# Patient Record
Sex: Male | Born: 1975 | Race: White | Hispanic: No | Marital: Married | Smoking: Current every day smoker
Health system: Southern US, Community
[De-identification: ages and names within clinical notes are randomized; demographics above are authoritative.]

## PROBLEM LIST (undated history)

## (undated) DIAGNOSIS — M1990 Unspecified osteoarthritis, unspecified site: Secondary | ICD-10-CM

## (undated) DIAGNOSIS — F4310 Post-traumatic stress disorder, unspecified: Secondary | ICD-10-CM

## (undated) DIAGNOSIS — N200 Calculus of kidney: Secondary | ICD-10-CM

## (undated) DIAGNOSIS — S0990XA Unspecified injury of head, initial encounter: Secondary | ICD-10-CM

## (undated) DIAGNOSIS — R51 Headache: Secondary | ICD-10-CM

## (undated) HISTORY — PX: KNEE SURGERY: SHX244

## (undated) HISTORY — PX: POSTERIOR CERVICAL LAMINECTOMY: SHX2248

## (undated) HISTORY — PX: CERVICAL FUSION: SHX112

## (undated) HISTORY — PX: OTHER SURGICAL HISTORY: SHX169

---

## 1998-06-24 ENCOUNTER — Emergency Department (HOSPITAL_COMMUNITY): Admission: EM | Admit: 1998-06-24 | Discharge: 1998-06-25 | Payer: Self-pay | Admitting: Emergency Medicine

## 1998-06-25 ENCOUNTER — Encounter: Payer: Self-pay | Admitting: Emergency Medicine

## 1998-06-27 ENCOUNTER — Observation Stay (HOSPITAL_COMMUNITY): Admission: RE | Admit: 1998-06-27 | Discharge: 1998-06-28 | Payer: Self-pay | Admitting: Specialist

## 2007-02-23 ENCOUNTER — Ambulatory Visit (HOSPITAL_BASED_OUTPATIENT_CLINIC_OR_DEPARTMENT_OTHER): Admission: RE | Admit: 2007-02-23 | Discharge: 2007-02-23 | Payer: Self-pay | Admitting: Orthopedic Surgery

## 2007-03-27 ENCOUNTER — Emergency Department (HOSPITAL_COMMUNITY): Admission: EM | Admit: 2007-03-27 | Discharge: 2007-03-27 | Payer: Self-pay | Admitting: Emergency Medicine

## 2007-12-29 ENCOUNTER — Encounter
Admission: RE | Admit: 2007-12-29 | Discharge: 2007-12-29 | Payer: Self-pay | Admitting: Physical Medicine & Rehabilitation

## 2008-05-22 ENCOUNTER — Encounter
Admission: RE | Admit: 2008-05-22 | Discharge: 2008-07-17 | Payer: Self-pay | Admitting: Physical Medicine & Rehabilitation

## 2008-05-23 ENCOUNTER — Ambulatory Visit: Payer: Self-pay | Admitting: Physical Medicine & Rehabilitation

## 2008-06-05 ENCOUNTER — Encounter: Admission: RE | Admit: 2008-06-05 | Discharge: 2008-06-20 | Payer: Self-pay | Admitting: Anesthesiology

## 2008-06-06 ENCOUNTER — Ambulatory Visit: Payer: Self-pay | Admitting: Anesthesiology

## 2008-06-20 ENCOUNTER — Ambulatory Visit: Payer: Self-pay | Admitting: Anesthesiology

## 2008-07-17 ENCOUNTER — Encounter
Admission: RE | Admit: 2008-07-17 | Discharge: 2008-07-18 | Payer: Self-pay | Admitting: Physical Medicine & Rehabilitation

## 2008-07-18 ENCOUNTER — Ambulatory Visit: Payer: Self-pay | Admitting: Physical Medicine & Rehabilitation

## 2008-08-28 ENCOUNTER — Encounter: Admission: RE | Admit: 2008-08-28 | Discharge: 2008-08-28 | Payer: Self-pay | Admitting: Anesthesiology

## 2008-09-08 ENCOUNTER — Emergency Department (HOSPITAL_COMMUNITY): Admission: EM | Admit: 2008-09-08 | Discharge: 2008-09-09 | Payer: Self-pay | Admitting: Emergency Medicine

## 2010-10-03 LAB — DIFFERENTIAL
Basophils Absolute: 0 10*3/uL (ref 0.0–0.1)
Basophils Relative: 0 % (ref 0–1)
Eosinophils Absolute: 0.1 10*3/uL (ref 0.0–0.7)
Eosinophils Relative: 1 % (ref 0–5)
Lymphocytes Relative: 24 % (ref 12–46)
Lymphs Abs: 2.1 10*3/uL (ref 0.7–4.0)
Monocytes Absolute: 0.9 10*3/uL (ref 0.1–1.0)
Monocytes Relative: 10 % (ref 3–12)
Neutro Abs: 5.9 10*3/uL (ref 1.7–7.7)
Neutrophils Relative %: 66 % (ref 43–77)

## 2010-10-03 LAB — COMPREHENSIVE METABOLIC PANEL
ALT: 17 U/L (ref 0–53)
AST: 21 U/L (ref 0–37)
Albumin: 4.4 g/dL (ref 3.5–5.2)
Alkaline Phosphatase: 70 U/L (ref 39–117)
BUN: 12 mg/dL (ref 6–23)
CO2: 25 mEq/L (ref 19–32)
Calcium: 9.6 mg/dL (ref 8.4–10.5)
Chloride: 103 mEq/L (ref 96–112)
Creatinine, Ser: 0.93 mg/dL (ref 0.4–1.5)
GFR calc Af Amer: >60 mL/min (ref >60)
GFR calc non Af Amer: >60 mL/min (ref >60)
Glucose, Bld: 98 mg/dL (ref 70–99)
Potassium: 3.6 mEq/L (ref 3.5–5.1)
Sodium: 137 mEq/L (ref 135–145)
Total Bilirubin: 1 mg/dL (ref 0.3–1.2)
Total Protein: 7.5 g/dL (ref 6.0–8.3)

## 2010-10-03 LAB — LIPASE, BLOOD: Lipase: 42 U/L (ref 11–59)

## 2010-10-03 LAB — CBC
HCT: 43.2 % (ref 39.0–52.0)
Hemoglobin: 15.2 g/dL (ref 13.0–17.0)
MCHC: 35.2 g/dL (ref 30.0–36.0)
MCV: 89.5 fL (ref 78.0–100.0)
Platelets: 271 10*3/uL (ref 150–400)
RBC: 4.83 MIL/uL (ref 4.22–5.81)
RDW: 11.8 % (ref 11.5–15.5)
WBC: 9 10*3/uL (ref 4.0–10.5)

## 2010-10-03 LAB — URINALYSIS, ROUTINE W REFLEX MICROSCOPIC
Bilirubin Urine: NEGATIVE
Ketones, ur: 15 mg/dL — AB
Nitrite: NEGATIVE
Protein, ur: 30 mg/dL — AB
Urobilinogen, UA: 1 mg/dL (ref 0.0–1.0)

## 2010-11-05 NOTE — Assessment & Plan Note (Signed)
A 35 year old male status post cervical fusion few C1-C3 areas, disk  degeneration C5-6 area as well.  The patient request non-narcotic  treatment.  He has undergone medial branch block per Dr. Stevphen Rochester,  June 06, 2008, bilateral C3, C4, C5, C6.  He had 100% relief for  several days following the first procedure.  He had a repeat procedure 2  weeks later demonstrating about 50% relief that lasted for several days.  Oswestry index score is 42%.  Current pain is 3/10.   REVIEW OF SYSTEMS:  Positive for depression, anxiety, night sweats,  constipation, urine problems as well as poor appetite.   MEDICATIONS:  MS Contin and Tylox as prescribed by Dr. Marina Goodell.   PHYSICAL EXAMINATION:  VITAL SIGNS:  Blood pressure 111/68, pulse 61,  respirations 18, and O2 sat 98% on room air.  GENERAL:  No acute distress.  NEUROLOGIC:  Orientation x3.  Affect is depressed, irritable.  Gait is  normal.  EXTREMITIES:  His strength is full in bilateral upper and lower  extremities, tenderness along the cervical paraspinal.  Sensation  normal.  Deep tendon reflexes normal.   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  We will send to Dr. Stevphen Rochester for radiofrequency for some right  side.  I will see him back after Dr. Stevphen Rochester has done with his  radiofrequency procedures may need some physical therapy afterwards.      Erick Colace, M.D.  Electronically Signed     AEK/MedQ  D:  07/18/2008 16:16:30  T:  07/19/2008 04:59:52  Job #:  [id number]   cc:   Brent Bulla, MD

## 2010-11-05 NOTE — Procedures (Signed)
NAME:  Chad Lowe, Chad Lowe NO.:  [id number]   MEDICAL RECORD NO.:  [id number]           PATIENT TYPE:   LOCATION:                                 FACILITY:   PHYSICIAN:  Celene Kras, MD        DATE OF BIRTH:  [birth year]   DATE OF PROCEDURE:  DATE OF DISCHARGE:                               OPERATIVE REPORT   [facility].  I  evaluated him and reviewed the Health and History form and 14-point  review of systems.  He is accompanied with his wife.  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  Now  it is spondylitic pain and review available.  Imaging in progress today.  Difficulty with endurance range of motion activities.  Suprascapular and  levator scapular pain interfering with his ability to work, and his  normal activities through the day.  He does not want to escalation  controlled substances, he has difficulty with sleep as well.  It is  reasonable to block the facet, particularly most problematic levels at  3, 4, 5, and 6 with contributory innervation addressed, right and left  side.  Rationales to minimize escalation controlled substances and maybe  one of our best nonsurgical approaches to moving forward with RF.  Benchmarks are given.  Other lifestyle enhancements for best outcome  including cigarette cessation are mandatory, and he will talk this over  with primary care.  He has both parents with COPD, mother dying early  from it, and so he is at pretty high risk.   Objectively, diffuse paracervical myofascial discomfort with positive  cervical facetal compression test, right and left.  Suboccipital  compression test positive, pain with extension.  Nothing new  neurologically.   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   PLAN:  Cervical facet medial branch  intervention C3, 4, 5, and 6 with  contributory innervation addressed, right and left side under local  anesthetic, independent needle access points.  Predicate further  intervention based on need and overall response.  Questions were  answered and discussed in lay terms, and I have used models.   The patient was taken to fluoroscopy suite and placed in supine  position.  Neck was prepped and draped in the usual fashion.  Using a 25-  gauge needle under local anesthetic, I advanced the cervical facet at  the medial branch C3, 4, 5, and C6, right and left side independent  needle access points.  Confirmed placement.  I then injected 0.5 mL of  lidocaine 1% MPF at each level with a total of 40 mg Aristocort in  divided dose.   He tolerated the procedure well.  No complications from the procedure.  Appropriate recovery was discussed with contact of  activities of daily  living.  We will see him in followup.            ______________________________  Celene Kras,  MD     HH/MEDQ  D:  06/06/2008 14:39:33  T:  06/07/2008 04:05:15  Job:  [id number]

## 2010-11-05 NOTE — Procedures (Signed)
NAME:  Chad Lowe, RUBEY NO.:  [id number]   MEDICAL RECORD NO.:  [id number]           PATIENT TYPE:   LOCATION:                                 FACILITY:   PHYSICIAN:  Celene Kras, MD        DATE OF BIRTH:  03-Oct-1975   DATE OF PROCEDURE:  DATE OF DISCHARGE:                               OPERATIVE REPORT   1. [place] had a 3-5 day near complete diminution in pain perception      after previous medial branch intervention, it is reasonable to go      on to another block sequentially, with dense local anesthetic to      assess efficacy prior to moving to RF.  Added biomechanical stress      below surgical fixation site, with the seat lowered, aggravated by      smoking and lifestyle issues.  I have reviewed that with him.   1. Other modifiable features in health profile discussed.  Home based      therapy, cigarette cessation, maintain contact with primary care.   Objectively, no significant interval change, diffuse paracervical  myofascial, positive cervical stable compression test right and left.  Suboccipital compression test positive.  Nothing new neurologically.   IMPRESSION:  Spondylosis, minimal myelopathy.  Status post fusion C1-2,  status post neck fracture.   PLAN:  Cervical facet medial branch intervention 3, 4, 5, and 6, right  and left side.  Independent needle access points under local anesthetic.  Contributory innervation addressed.  He has consented for today's  procedure.  We used 0.5% Marcaine, follow up with him and see him back  in a month to determine if RF is a viable option for him, with  benchmarks given.  He has consented for today's procedure.   The patient was taken to the fluoroscopy suite, placed in supine  position.  Neck was prepped and draped in usual fashion.  Using a 25-  gauge needle, I advanced the cervical facet at the medial branch at C3,  C4, C5, and C6 right and left side.  Independent needle access points  confirmed  placement, I then injected 0.5% mL of Marcaine 0.5% MPF at  each level with a total of 40 mg Aristocort in divided dose, right and  left side included.   He tolerated the procedure well.  We will assess within the context of  activities of daily living.  We will see him in followup.  No barrier to  communication.           ______________________________  Celene Kras, MD     HH/MEDQ  D:  06/20/2008 10:21:49  T:  06/20/2008 22:52:17  Job:  [id number]

## 2010-11-05 NOTE — Op Note (Signed)
[name], Chad Lowe              ACCOUNT NO.:  [id number]   MEDICAL RECORD NO.:  [id number]          PATIENT TYPE:  AMB   LOCATION:  DSC                          FACILITY:  [facility]   PHYSICIAN:  Chad Fitch. Lowe, M.D. DATE OF BIRTH:  11-19-1975   DATE OF PROCEDURE:  02/23/2007  DATE OF DISCHARGE:                               OPERATIVE REPORT   PREOPERATIVE DIAGNOSIS:  Chronic pain, right thumb metacarpophalangeal  joint due to on the job injury sustained Nov 21, 2006, followed by 3  months of work disability and chronic pain despite splinting, activity  modification, anti-inflammatory medication and rehabilitation.   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   OPERATION:  1. Examination of right thumb under anesthesia with comparison      examination of left thumb under anesthesia to determine      metacarpophalangeal joint stability.  This exam documented      instability of the volar plate and ulnar collateral ligament of the      right thumb metacarpophalangeal joint.  2. 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  3. Stabilization of right thumb metacarpophalangeal joint by ulnar      volar sesamoid arthrodesis/tenodesis to metacarpal neck utilizing a      Arthrex anchor into the metacarpal neck and lasso technique on      ulnar  sesamoid with 0.045 inches Kirschner wire stabilization of      right thumb metacarpophalangeal joint and 25 degrees of flexion and      slight ulnar deviation.   OPERATING SURGEON:  Chad Lowe, M.D.   ASSISTANT:  Chad Maduro Dasnoit PA-C.   ANESTHESIA:  General by LMA, supervising anesthesiologist is Dr. Krista Lowe.   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   On Nov 21, 2006 while vigorously pulling on rubber fragments, he felt a  pop in his right thumb metacarpophalangeal joint and had immediate pain  and work impairment.  He was seen by his family physician, Dr. Manson Lowe and  subsequently referred for hand surgery consult on December 14, 2006 due to  inability to recover from what was thought to be a minor thumb MP joint  sprain.   At the time of this initial consultation in our office we identified,  marked pain in the region of his right thumb metacarpophalangeal joint  and irregularity of the dorsal metacarpal head.  I was  concerned that he  may have sustained a hyperextension injury and possible stress fracture  or compression injury to the metacarpal head.  Mr. Chad Lowe demonstrated  a popping motion with flexion extension and radial ulnar deviation  movement of the thumb MP joint.   We sent him for three phase bone scan looking to confirm a fracture.  The three phase bone scan was completed at [facility] on December 24, 2006 and was interpreted by Dr. Molli Lowe radiologist to reveal no  evidence for asymmetrical activity in the right thumb on first, second  or third phases consistent with an absence of a fracture.   This exam however does not rule out a chronic instability nor does it  rule out a pain predicament.   Upon return on January 01, 2007, Mr. Chad Lowe was noted to have a cyst or  swelling formed along the ulnar aspect of his joint that was brought to  be consistent with a  chronic ulnar collateral ligament sprain and/or  post-traumatic ganglion cyst formation.   At that point in time due to his chronic work impairment, we recommended  examination of his right thumb under anesthesia, anticipating arthrotomy  and probable collateral ligament reconstruction.  After a lengthy  informed consent with Mr. Chad Lowe and his wife as well as a lengthy  dialogue with the worker's compensation insurance company, Mr. Chad Lowe  now presents for clinical evaluation of his right thumb in the operating  room.   Preoperatively we advised him that our working diagnosis was instability  of the right thumb MP joint with early post-traumatic arthritis.   I informed him that we had no way of replacing hyaline articular  cartilage of the thumb and would carefully assess his thumb and choose a  pathway of either stabilization at this time or salvage through  arthrodesis.   I advised them that at this point in time I was not comfortable  proceeding with an arthrodesis as his bone scan did not document  advanced arthritis and his plain films still showed adequate hyaline  cartilage space in a neutral position.   I did explain that we might repair collateral ligament or perform a  volar plate tenodesis to stabilize the thumb.   Mr. Chad Lowe and his wife agreed to our plan to carefully examine thumb  under anesthesia, compare it with the left thumb and decide on  stabilization technique intraoperatively.   He is brought to the operating room at this time anticipating a thumb  stabilization and also fully aware that he may require a second  procedure to arthrodesis the thumb to allow him to return to gainful  employment as a laborer.   Questions regarding surgery invited and answered in detail.   PROCEDURE:  Chad Lowe is brought to the operating room and placed  in supine position on the operating table.   Following anesthesia consult with Dr. Krista Lowe, general anesthesia by  LMA  was recommended and accepted.   He is brought to room 6, placed in supine position on the operating  table and under Dr. Robina Ade direct supervision, general anesthesia  induced.   The right arm was prepped with Betadine soap and solution, sterilely  draped.  1 gram of Ancef was administered as IV prophylactic antibiotic.   The procedure commenced with careful examination of the left and right  thumb MP joints under anesthesia.  The right thumb MP joint was noted to  have hyperextension of 60 degrees, further flexion of 80 degrees,  ulnar  deviation instability at neutral of 15 degrees, at 30 degrees of  approximately 20 degrees, and radial deviation instability of 25 degrees  at neutral and 35 degrees at 30 degrees flexion.  On the left Mr.  Byron was noted have hyperextension of nearly 80 degrees with a clunk,  further flexion to 80 degrees, ulnar deviation at neutral of  approximately 20 degrees, ulnar deviation at 30 degrees flexion of 25  degrees, essentially equal to the left, however, with radial deviation  stress at neutral, he had 30 degrees of radial deviation and at 30  degrees MP flexion, 40 or more degrees of ulnar deviation instability  was noted.   I concluded that he had a insufficient volar plate with post traumatic  changes in his metacarpal dorsal surface and ulnar deviation  instability.   This can be a very challenging combination to correct with soft tissue  technique.   I elected that point to proceed with exploration of the joint through a  standard dorsal ulnar arthrotomy approaching the joint in a manner that  would allow [place] to carefully inspect the dorsal aspect of the MP joint as  well as the ulnar collateral ligament with consideration of possible  repair of the ulnar collateral ligament if necessary.   A curvilinear incision was fashioned exposing the extensor mechanism.  Extreme care was taken to identify and retract all the ulnar dorsal   sensory branches.  The extensor was split between the adductor tendon  and the central slip and the ulnar capsule and collateral ligament  identified.  An arthrotomy was performed just dorsal to the ulnar  collateral ligament.  The origin of the ulnar collateral ligament on the  metacarpal head was normal.  The distal insertion was partially ruptured  and hyalinized.  This was a grade 3 ligament injury that was partial  primarily dorsal.   The dorsal surface of the metacarpal head was completely denuded of  hyaline cartilage.  It was apparent that when Mr. Louvier would  hyperextend beyond 5 degrees of extension, he was articulating against  bone without hyaline cartilage.  This is likely the source of his  popping and discomfort.  His proximal phalangeal articular surface was  basically intact.  Careful inspection of the mid surface of the  metacarpal head revealed grade 2 chondromalacia over a considerable  portion of the central and ulnar aspect of the metacarpal head.   Given the circumstances, in my judgment tenodesis or arthrodesis of the  sesamoid to prevent hyperextension of the joint was a reasonable  alternative.  Tenodesis of the ulnar sesamoid and capsular structures  should also increase the tension in the ulnar collateral ligament  complex and further stabilize the MP joint.  Therefore we elected to  proceed with exploration of the volar aspect of the MP joint and  arthrotomy on the radial border of the ulnar sesamoid and distal surface  of the sesamoid to create a ulnar sesamoid metacarpal neck tenodesis  lash arthrodesis.   With great care of the incision was extended across the volar surface of  the MP joint exposing the ulnar proper digital artery and nerve which  were gently retracted followed by identification of the flexor sheath.  An arthrotomy was performed along the ulnar aspect of the flexor sheath  and the ulnar sesamoid palpated.  An arthrotomy was created  along the  radial border of the ulnar sesamoid, allowing [place] to enter the MP joint.  The metacarpal head  and neck were identified and confirmed by placement  of a 0.028-inch Kirschner wire.  We confirmed proper position on the  radial border of the ulnar sesamoid.   I then used a bone awl to create a bleeding bone surface on the neck of  the metacarpal and the deep surface of the sesamoid was subsequently  roughened with a periosteal elevator.  With great care a Arthrex suture  anchor was placed at a slightly oblique angle with a tent stake in the  metacarpal neck controlled with a C-arm fluoroscope followed by use of a  small Mayo needle to lasso the distal portion of the ulnar sesamoid  taking care to avoid the ulnar neurovascular bundle.  The suture was  then tied around the sesamoid and over the sesamoid insetting it into  the metacarpal neck.   This created a very satisfactory tenodesis/arthrodesis of the ulnar  sesamoid limiting the MP extension to 5 degrees flexion.   The suture was properly tensioned and the MP joint secured at 20 degrees  of flexion with a 0.045-inch Kirschner wire driven across the MP joint.  Care was taken to slightly ulnarly deviate the MP joint to optimize  healing of the ulnar collateral ligament while immobilized.   This construct appeared to also indirectly tension some the ulnar  capsular structures.   The wounds were thoroughly irrigated and the ulnar neurovascular bundle  replaced in its anatomic position.  The skin wound was repaired on the  volar surface of the thumb with mattress suture of 5-0 nylon and the  extensor incision repaired with a series of figure-of-eight sutures of 4-  0 [place].  The skin was repaired dorsally with mattress suture of 5-0  nylon.   The pin was dressed in the usual manner followed by application of a  forearm based thumb spica splint.   There no apparent complications.   Mr. Ferrick tolerated surgery  anesthesia well.  He is transferred to  recovery room with stable vital signs.   He will be discharged home to the care of his wife with prescriptions  for Percocet 5 mg one by mouth every four to six hours as needed for  pain also Keflex 500 mg one by mouth every eight hours x4 days as a  prophylactic antibiotic and Motrin 600 mg one by mouth every six hours  as needed for pain 30 tablets with one refill.      Chad Lowe, M.D.  Electronically Signed     RVS/MEDQ  D:  02/23/2007  T:  02/23/2007  Job:  [id number]   cc:   Norva Pavlov, M.D.

## 2010-11-05 NOTE — Consult Note (Signed)
REQUESTING PHYSICIAN:  Brent Bulla, MD, at [facility]  in [place], [place].   Consult request is for the evaluation of chronic pain.   CHIEF COMPLAINT:  Neck pain.   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  I do not have any original  reports.  I have reviewed reports from Dr. Delma Officer from [facility]  Neurosurgery with the lateral spine flexion/extension views on December 31, 2007, showing transarticular screws placed at C1 and C2 with bony  effusion at C1 and C2 as well as C2 and C3.  No evidence of subluxation.  He has disk degeneration presumably with decreased height, C5 and C6.  He has had a CT of the cervical spine dated January 06, 2008, showing  retrolisthesis, superior dense fragment 4-5 mm and 6-mm right lateral  displacement.  Good placement of C2 pedicle screws, left paracentral  disk protrusion at C5-C6.   His last visit with Dr. Lovell Sheehan that I see is in July basically stating  that the patient is fused.  He has had no significant radicular  symptomatology, so no surgical intervention was recommended.  Of note,  this patient also had MRI of cervical spine on November 26, 2007, and through  the Neurosurgery note, I have noted that he has moderate central left  neuroforaminal stenosis at C5-C6.   His other past medical history is significant for a knee injury.  His  past surgical history as noted.  He has had transarticular screw and  bone graft in August 2002.  He has had metacarpophalangeal joint pain  due to job injury on Nov 21, 2006, and eventually underwent surgery for  this, which looked like fusion, right thumb metacarpophalangeal joint.   FAMILY HISTORY:  Mother died of emphysema.  Father is 71 years old, with  emphysema.   SOCIAL HISTORY:  He is married, lives in [place].  Social alcohol use.  Denies drug use.  Smokes half a pack per day.  He reviewed [place],  Controlled Substance Reporting, has received controlled  substances from several doctors over the years spanning between 2008-  2009 including Dr. Gillis Ends, Dr. Josephine Igo, Dr. Cindee Salt, Minerva Areola  __________, Seth Bake, Dr. Shirlean Mylar,  Brent Bulla, and Dossie Der.  Most recently these have been primarily through Dr. Shirlean Mylar in [place].   REVIEW OF SYSTEMS:  Positive for tingling, spasms, depression, anxiety,  respiratory infection, and night sweats.   Pain average 4/10 described as sharp, stabbing, constant aching  interferes with activity at moderate level.  Sleep is poor.  Pain is  worse with walking, bending, and standing, improves with rest and  medication.  Relief from meds is fair.  Walking tolerance 1-1/2 hour to  1 hour.  He can climb, states he can drive.  He needs some assist with  certain household duties and not employed since November 22, 2006.   PHYSICAL EXAMINATION:  VITAL SIGNS:  Blood pressure 114/54, respirations  18, and 100% on room air.  GENERAL:  A well-developed, well-nourished male in no acute distress.  NEUROLOGIC:  Orientation x3.  Affect is alert.  Gait is normal, able to  toe walk and heel walk.  Coordination normal.  Upper and lower extremity  deep tendon reflexes normal.  Upper and lower extremities without edema.  Neck range of motion is limited in terms of lateral rotation and  flexion/extension  is approximately a 50% range.   A 5/5 strength bilateral deltoid, biceps, grip as well as hip flexion,  knee extension, ankle dorsiflexion.  Sensory exam is normal to light  touch and pinprick in upper and lower extremities.  Deep tendon reflexes  are normal in upper and lower extremities.  He has tenderness to  palpation bilateral cervical paraspinals.  He has pain with extension  greater than with flexion.  His back has no significant pain to  palpation.   IMPRESSION:  Cervical post-laminectomy syndrome.  I believe that he has  some  cervical spondylosis with facet syndrome below the level of fusion.  No radicular discomfort alternately could be cervical degenerative disk  causing pain.  We will get some medial branch blocks to further assess.  We will refer him to Dr. Stevphen Rochester for this.   We will give him Voltaren gel.  We will check urine drug screen.  If  urine drug screen looks okay, may be able to take over on his MS Contin,  takes 30 mg t.i.d. rather than using oxycodone.  For breakthrough, would  use morphine sulfate IR.      Erick Colace, M.D.  Electronically Signed     AEK/MedQ  D:05/29/2008 16:33:50  T:05/30/2008 06:04:38  Job #:  [id number]

## 2011-04-04 LAB — POCT HEMOGLOBIN-HEMACUE
Hemoglobin: 14
Operator id: 123881

## 2013-10-31 ENCOUNTER — Other Ambulatory Visit: Payer: Self-pay | Admitting: Neurosurgery

## 2013-11-28 ENCOUNTER — Encounter (HOSPITAL_COMMUNITY)
Admission: RE | Admit: 2013-11-28 | Discharge: 2013-11-28 | Disposition: A | Discharge status: Home / Self Care | Payer: Commercial Managed Care - PPO | Source: Ambulatory Visit | Attending: Neurosurgery | Admitting: Neurosurgery

## 2013-11-28 ENCOUNTER — Encounter (HOSPITAL_COMMUNITY): Payer: Self-pay

## 2013-11-28 DIAGNOSIS — Z01812 Encounter for preprocedural laboratory examination: Secondary | ICD-10-CM | POA: Insufficient documentation

## 2013-11-28 HISTORY — DX: Unspecified injury of head, initial encounter: S09.90XA

## 2013-11-28 HISTORY — DX: Headache: R51

## 2013-11-28 HISTORY — DX: Post-traumatic stress disorder, unspecified: F43.10

## 2013-11-28 HISTORY — DX: Unspecified osteoarthritis, unspecified site: M19.90

## 2013-11-28 HISTORY — DX: Calculus of kidney: N20.0

## 2013-11-28 LAB — SURGICAL PCR SCREEN
MRSA, PCR: POSITIVE — AB
Staphylococcus aureus: POSITIVE — AB

## 2013-11-28 LAB — BASIC METABOLIC PANEL
BUN: 9 mg/dL (ref 6–23)
CHLORIDE: 103 meq/L (ref 96–112)
CO2: 28 meq/L (ref 19–32)
CREATININE: 0.99 mg/dL (ref 0.50–1.35)
Calcium: 9.5 mg/dL (ref 8.4–10.5)
GFR calc Af Amer: >90 mL/min (ref >90)
GFR calc non Af Amer: >90 mL/min (ref >90)
Glucose, Bld: 87 mg/dL (ref 70–99)
POTASSIUM: 3.8 meq/L (ref 3.7–5.3)
Sodium: 142 mEq/L (ref 137–147)

## 2013-11-28 LAB — CBC
HEMATOCRIT: 35.5 % — AB (ref 39.0–52.0)
HEMOGLOBIN: 12.9 g/dL — AB (ref 13.0–17.0)
MCH: 31.5 pg (ref 26.0–34.0)
MCHC: 36.3 g/dL — AB (ref 30.0–36.0)
MCV: 86.6 fL (ref 78.0–100.0)
Platelets: 203 10*3/uL (ref 150–400)
RBC: 4.1 MIL/uL — ABNORMAL LOW (ref 4.22–5.81)
RDW: 12.4 % (ref 11.5–15.5)
WBC: 8.1 10*3/uL (ref 4.0–10.5)

## 2013-11-28 NOTE — Progress Notes (Signed)
I called a prescription for Mupirocin ointment to [address], [address]/ [address] , [place], [place]

## 2013-11-28 NOTE — Pre-Procedure Instructions (Addendum)
Chad Lowe  11/28/2013   Your procedure is scheduled on: Monday, June 15.  Report to [facility] Admitting at 8:15  Call this number if you have problems the morning of surgery: [phone]   Remember:   Do not eat food or drink liquids after midnight Sunday, June 14.  Take these medicines the morning of surgery with A SIP OF WATER: Take if needed:Oxycodone.     Do not wear jewelry, make-up or nail polish.  Do not wear lotions, powders, or perfumes.                Men may shave face and neck.  Do not bring valuables to the hospital.                [facility] is not responsible      for any belongings or valuables.               Contacts, dentures or bridgework may not be worn into surgery.  Leave suitcase in the car. After surgery it may be brought to your room.  For patients admitted to the hospital, discharge time is determined by your  treatment team.               Patients discharged the day of surgery will not be allowed to drive home.  Name and phone number of your driver:-   Special Instructions: Review  [facility] - Preparing For Surgery.   Please read over the following fact sheets that you were given: Pain Booklet, Coughing and Deep Breathing and Surgical Site Infection Prevention

## 2013-11-29 ENCOUNTER — Other Ambulatory Visit (HOSPITAL_COMMUNITY): Payer: Self-pay | Admitting: *Deleted

## 2013-12-04 MED ORDER — CEFAZOLIN SODIUM-DEXTROSE 2-3 GM-% IV SOLR
2.0000 g | INTRAVENOUS | Status: AC
  Administered 2013-12-05: 2 g via INTRAVENOUS
  Filled 2013-12-04: qty 50

## 2013-12-05 ENCOUNTER — Ambulatory Visit (HOSPITAL_COMMUNITY): Payer: Commercial Managed Care - PPO

## 2013-12-05 ENCOUNTER — Ambulatory Visit (HOSPITAL_COMMUNITY): Payer: Commercial Managed Care - PPO | Admitting: Certified Registered Nurse Anesthetist

## 2013-12-05 ENCOUNTER — Encounter (HOSPITAL_COMMUNITY): Payer: Self-pay | Admitting: *Deleted

## 2013-12-05 ENCOUNTER — Encounter (HOSPITAL_COMMUNITY): Payer: Commercial Managed Care - PPO | Admitting: Certified Registered Nurse Anesthetist

## 2013-12-05 ENCOUNTER — Encounter (HOSPITAL_COMMUNITY)
Admission: RE | Disposition: A | Discharge status: Home / Self Care | Payer: Self-pay | Source: Ambulatory Visit | Attending: Neurosurgery

## 2013-12-05 ENCOUNTER — Ambulatory Visit (HOSPITAL_COMMUNITY)
Admission: RE | Admit: 2013-12-05 | Discharge: 2013-12-05 | Disposition: A | Discharge status: Home / Self Care | Payer: Commercial Managed Care - PPO | Source: Ambulatory Visit | Attending: Neurosurgery | Admitting: Neurosurgery

## 2013-12-05 DIAGNOSIS — M1710 Unilateral primary osteoarthritis, unspecified knee: Secondary | ICD-10-CM | POA: Insufficient documentation

## 2013-12-05 DIAGNOSIS — M5030 Other cervical disc degeneration, unspecified cervical region: Secondary | ICD-10-CM | POA: Insufficient documentation

## 2013-12-05 DIAGNOSIS — G8929 Other chronic pain: Secondary | ICD-10-CM | POA: Insufficient documentation

## 2013-12-05 DIAGNOSIS — F17200 Nicotine dependence, unspecified, uncomplicated: Secondary | ICD-10-CM | POA: Insufficient documentation

## 2013-12-05 DIAGNOSIS — IMO0002 Reserved for concepts with insufficient information to code with codable children: Secondary | ICD-10-CM

## 2013-12-05 DIAGNOSIS — M47812 Spondylosis without myelopathy or radiculopathy, cervical region: Secondary | ICD-10-CM | POA: Insufficient documentation

## 2013-12-05 DIAGNOSIS — M4722 Other spondylosis with radiculopathy, cervical region: Secondary | ICD-10-CM | POA: Diagnosis present

## 2013-12-05 DIAGNOSIS — F4310 Post-traumatic stress disorder, unspecified: Secondary | ICD-10-CM | POA: Insufficient documentation

## 2013-12-05 HISTORY — PX: ANTERIOR CERVICAL DECOMP/DISCECTOMY FUSION: SHX1161

## 2013-12-05 SURGERY — ANTERIOR CERVICAL DECOMPRESSION/DISCECTOMY FUSION 2 LEVELS
Anesthesia: General | Site: Spine Cervical

## 2013-12-05 MED ORDER — SODIUM CHLORIDE 0.9 % IR SOLN
Status: DC | PRN
  Administered 2013-12-05: 11:00:00

## 2013-12-05 MED ORDER — HEMOSTATIC AGENTS (NO CHARGE) OPTIME
TOPICAL | Status: DC | PRN
  Administered 2013-12-05: 1 via TOPICAL

## 2013-12-05 MED ORDER — ALUM & MAG HYDROXIDE-SIMETH 200-200-20 MG/5ML PO SUSP: 30.0000 mL | Freq: Four times a day (QID) | ORAL | Status: DC | PRN

## 2013-12-05 MED ORDER — LIDOCAINE HCL (CARDIAC) 20 MG/ML IV SOLN
INTRAVENOUS | Status: AC
  Filled 2013-12-05: qty 5

## 2013-12-05 MED ORDER — ONDANSETRON HCL 4 MG/2ML IJ SOLN
INTRAMUSCULAR | Status: AC
  Filled 2013-12-05: qty 2

## 2013-12-05 MED ORDER — ONDANSETRON HCL 4 MG/2ML IJ SOLN
INTRAMUSCULAR | Status: DC | PRN
  Administered 2013-12-05: 4 mg via INTRAVENOUS

## 2013-12-05 MED ORDER — FENTANYL CITRATE 0.05 MG/ML IJ SOLN
INTRAMUSCULAR | Status: AC
  Filled 2013-12-05: qty 5

## 2013-12-05 MED ORDER — LACTATED RINGERS IV SOLN
INTRAVENOUS | Status: DC
  Administered 2013-12-05: 09:00:00 via INTRAVENOUS

## 2013-12-05 MED ORDER — DOCUSATE SODIUM 100 MG PO CAPS
100.0000 mg | ORAL_CAPSULE | Freq: Two times a day (BID) | ORAL | Status: DC
  Administered 2013-12-05: 100 mg via ORAL
  Filled 2013-12-05: qty 1

## 2013-12-05 MED ORDER — ONDANSETRON HCL 4 MG/2ML IJ SOLN: 4.0000 mg | Freq: Four times a day (QID) | INTRAMUSCULAR | Status: DC | PRN

## 2013-12-05 MED ORDER — OXYCODONE-ACETAMINOPHEN 5-325 MG PO TABS
1.0000 | ORAL_TABLET | ORAL | Status: DC | PRN
  Administered 2013-12-05: 2 via ORAL
  Filled 2013-12-05: qty 2

## 2013-12-05 MED ORDER — NEOSTIGMINE METHYLSULFATE 10 MG/10ML IV SOLN
INTRAVENOUS | Status: DC | PRN
  Administered 2013-12-05: 3 mg via INTRAVENOUS

## 2013-12-05 MED ORDER — HYDROMORPHONE HCL PF 1 MG/ML IJ SOLN
0.2500 mg | INTRAMUSCULAR | Status: DC | PRN
  Administered 2013-12-05 (×2): 0.5 mg via INTRAVENOUS

## 2013-12-05 MED ORDER — OXYCODONE HCL 5 MG PO TABS: 15.0000 mg | ORAL_TABLET | ORAL | Status: DC | PRN

## 2013-12-05 MED ORDER — LIDOCAINE HCL (CARDIAC) 20 MG/ML IV SOLN
INTRAVENOUS | Status: DC | PRN
  Administered 2013-12-05: 80 mg via INTRAVENOUS

## 2013-12-05 MED ORDER — MIDAZOLAM HCL 2 MG/2ML IJ SOLN
INTRAMUSCULAR | Status: AC
  Filled 2013-12-05: qty 2

## 2013-12-05 MED ORDER — PHENOL 1.4 % MT LIQD: 1.0000 | OROMUCOSAL | Status: DC | PRN

## 2013-12-05 MED ORDER — DSS 100 MG PO CAPS: 100.0000 mg | ORAL_CAPSULE | Freq: Two times a day (BID) | ORAL | Status: DC

## 2013-12-05 MED ORDER — OXYBUTYNIN CHLORIDE 5 MG PO TABS: 10.0000 mg | ORAL_TABLET | Freq: Every day | ORAL | Status: DC

## 2013-12-05 MED ORDER — MENTHOL 3 MG MT LOZG: 1.0000 | LOZENGE | OROMUCOSAL | Status: DC | PRN

## 2013-12-05 MED ORDER — OXYCODONE HCL 5 MG/5ML PO SOLN: 5.0000 mg | Freq: Once | ORAL | Status: DC | PRN

## 2013-12-05 MED ORDER — OXYCODONE HCL 15 MG PO TABS: 15.0000 mg | ORAL_TABLET | ORAL | Status: DC | PRN

## 2013-12-05 MED ORDER — ALPRAZOLAM 0.5 MG PO TABS: 2.0000 mg | ORAL_TABLET | Freq: Every evening | ORAL | Status: DC | PRN

## 2013-12-05 MED ORDER — DEXAMETHASONE SODIUM PHOSPHATE 4 MG/ML IJ SOLN: 4.0000 mg | Freq: Four times a day (QID) | INTRAMUSCULAR | Status: DC

## 2013-12-05 MED ORDER — ACETAMINOPHEN 325 MG PO TABS: 650.0000 mg | ORAL_TABLET | ORAL | Status: DC | PRN

## 2013-12-05 MED ORDER — LACTATED RINGERS IV SOLN
INTRAVENOUS | Status: DC | PRN
  Administered 2013-12-05 (×2): via INTRAVENOUS

## 2013-12-05 MED ORDER — MIDAZOLAM HCL 5 MG/5ML IJ SOLN
INTRAMUSCULAR | Status: DC | PRN
  Administered 2013-12-05: 2 mg via INTRAVENOUS

## 2013-12-05 MED ORDER — THROMBIN 5000 UNITS EX SOLR
CUTANEOUS | Status: DC | PRN
  Administered 2013-12-05 (×2): 5000 [IU] via TOPICAL

## 2013-12-05 MED ORDER — ARTIFICIAL TEARS OP OINT
TOPICAL_OINTMENT | OPHTHALMIC | Status: DC | PRN
  Administered 2013-12-05: 1 via OPHTHALMIC

## 2013-12-05 MED ORDER — DEXAMETHASONE SODIUM PHOSPHATE 4 MG/ML IJ SOLN
INTRAMUSCULAR | Status: AC
  Filled 2013-12-05: qty 2

## 2013-12-05 MED ORDER — MORPHINE SULFATE 2 MG/ML IJ SOLN
1.0000 mg | INTRAMUSCULAR | Status: DC | PRN
  Administered 2013-12-05: 4 mg via INTRAVENOUS
  Filled 2013-12-05 (×2): qty 1

## 2013-12-05 MED ORDER — ROCURONIUM BROMIDE 100 MG/10ML IV SOLN
INTRAVENOUS | Status: DC | PRN
  Administered 2013-12-05: 50 mg via INTRAVENOUS
  Administered 2013-12-05 (×3): 10 mg via INTRAVENOUS

## 2013-12-05 MED ORDER — DEXAMETHASONE SODIUM PHOSPHATE 4 MG/ML IJ SOLN
INTRAMUSCULAR | Status: DC | PRN
  Administered 2013-12-05: 8 mg via INTRAVENOUS

## 2013-12-05 MED ORDER — BUPIVACAINE-EPINEPHRINE (PF) 0.5% -1:200000 IJ SOLN
INTRAMUSCULAR | Status: DC | PRN
  Administered 2013-12-05: 10 mL

## 2013-12-05 MED ORDER — LACTATED RINGERS IV SOLN: INTRAVENOUS | Status: DC

## 2013-12-05 MED ORDER — PROPOFOL 10 MG/ML IV BOLUS
INTRAVENOUS | Status: AC
  Filled 2013-12-05: qty 20

## 2013-12-05 MED ORDER — DIAZEPAM 5 MG PO TABS: 5.0000 mg | ORAL_TABLET | Freq: Four times a day (QID) | ORAL | Status: DC | PRN

## 2013-12-05 MED ORDER — NEOSTIGMINE METHYLSULFATE 10 MG/10ML IV SOLN
INTRAVENOUS | Status: AC
  Filled 2013-12-05: qty 1

## 2013-12-05 MED ORDER — CEFAZOLIN SODIUM-DEXTROSE 2-3 GM-% IV SOLR
2.0000 g | Freq: Three times a day (TID) | INTRAVENOUS | Status: DC
  Administered 2013-12-05: 2 g via INTRAVENOUS
  Filled 2013-12-05 (×2): qty 50

## 2013-12-05 MED ORDER — OXYCODONE HCL 5 MG PO TABS
5.0000 mg | ORAL_TABLET | Freq: Once | ORAL | Status: DC | PRN
Start: 2013-12-05 — End: 2013-12-05

## 2013-12-05 MED ORDER — OXYCODONE HCL 5 MG PO TABS
10.0000 mg | ORAL_TABLET | Freq: Every day | ORAL | Status: DC
Start: 2013-12-05 — End: 2013-12-05
  Administered 2013-12-05: 10 mg via ORAL
  Filled 2013-12-05: qty 2

## 2013-12-05 MED ORDER — ARTIFICIAL TEARS OP OINT
TOPICAL_OINTMENT | OPHTHALMIC | Status: AC
  Filled 2013-12-05: qty 3.5

## 2013-12-05 MED ORDER — BACITRACIN ZINC 500 UNIT/GM EX OINT
TOPICAL_OINTMENT | CUTANEOUS | Status: DC | PRN
  Administered 2013-12-05: 1 via TOPICAL

## 2013-12-05 MED ORDER — FENTANYL CITRATE 0.05 MG/ML IJ SOLN
INTRAMUSCULAR | Status: DC | PRN
  Administered 2013-12-05: 50 ug via INTRAVENOUS
  Administered 2013-12-05: 150 ug via INTRAVENOUS
  Administered 2013-12-05 (×2): 50 ug via INTRAVENOUS

## 2013-12-05 MED ORDER — GLYCOPYRROLATE 0.2 MG/ML IJ SOLN
INTRAMUSCULAR | Status: AC
  Filled 2013-12-05: qty 2

## 2013-12-05 MED ORDER — PROPOFOL 10 MG/ML IV BOLUS
INTRAVENOUS | Status: DC | PRN
  Administered 2013-12-05: 200 mg via INTRAVENOUS

## 2013-12-05 MED ORDER — OXYCODONE HCL 10 MG PO TABS: 10.0000 mg | ORAL_TABLET | Freq: Every day | ORAL | Status: DC

## 2013-12-05 MED ORDER — HYDROCODONE-ACETAMINOPHEN 5-325 MG PO TABS: 1.0000 | ORAL_TABLET | ORAL | Status: DC | PRN

## 2013-12-05 MED ORDER — ACETAMINOPHEN 650 MG RE SUPP: 650.0000 mg | RECTAL | Status: DC | PRN

## 2013-12-05 MED ORDER — DEXAMETHASONE 4 MG PO TABS
4.0000 mg | ORAL_TABLET | Freq: Four times a day (QID) | ORAL | Status: DC
  Administered 2013-12-05 (×2): 4 mg via ORAL
  Filled 2013-12-05 (×3): qty 1

## 2013-12-05 MED ORDER — ONDANSETRON HCL 4 MG/2ML IJ SOLN
4.0000 mg | INTRAMUSCULAR | Status: DC | PRN
Start: 2013-12-05 — End: 2013-12-05

## 2013-12-05 MED ORDER — HYDROMORPHONE HCL PF 1 MG/ML IJ SOLN
INTRAMUSCULAR | Status: AC
  Filled 2013-12-05: qty 1

## 2013-12-05 MED ORDER — ROCURONIUM BROMIDE 50 MG/5ML IV SOLN
INTRAVENOUS | Status: AC
  Filled 2013-12-05: qty 2

## 2013-12-05 MED ORDER — GLYCOPYRROLATE 0.2 MG/ML IJ SOLN
INTRAMUSCULAR | Status: DC | PRN
  Administered 2013-12-05: 0.4 mg via INTRAVENOUS

## 2013-12-05 MED ORDER — DIAZEPAM 5 MG PO TABS
5.0000 mg | ORAL_TABLET | Freq: Four times a day (QID) | ORAL | Status: DC | PRN
  Administered 2013-12-05: 5 mg via ORAL
  Filled 2013-12-05: qty 1

## 2013-12-05 MED ORDER — 0.9 % SODIUM CHLORIDE (POUR BTL) OPTIME
TOPICAL | Status: DC | PRN
  Administered 2013-12-05: 1000 mL

## 2013-12-05 SURGICAL SUPPLY — 65 items
BAG DECANTER FOR FLEXI CONT (MISCELLANEOUS) ×3 IMPLANT
BENZOIN TINCTURE PRP APPL 2/3 (GAUZE/BANDAGES/DRESSINGS) ×3 IMPLANT
BIT DRILL NEURO 2X3.1 SFT TUCH (MISCELLANEOUS) ×1 IMPLANT
BLADE 10 SAFETY STRL DISP (BLADE) IMPLANT
BLADE SURG 15 STRL LF DISP TIS (BLADE) ×1 IMPLANT
BLADE SURG 15 STRL SS (BLADE) ×2
BLADE ULTRA TIP 2M (BLADE) ×3 IMPLANT
BRUSH SCRUB EZ PLAIN DRY (MISCELLANEOUS) ×3 IMPLANT
BUR BARREL STRAIGHT FLUTE 4.0 (BURR) ×3 IMPLANT
BUR MATCHSTICK NEURO 3.0 LAGG (BURR) ×3 IMPLANT
CANISTER SUCT 3000ML (MISCELLANEOUS) ×3 IMPLANT
CLOSURE WOUND 1/2 X4 (GAUZE/BANDAGES/DRESSINGS) ×1
CONT SPEC 4OZ CLIKSEAL STRL BL (MISCELLANEOUS) ×3 IMPLANT
COVER MAYO STAND STRL (DRAPES) ×3 IMPLANT
DRAPE LAPAROTOMY 100X72 PEDS (DRAPES) ×3 IMPLANT
DRAPE MICROSCOPE LEICA (MISCELLANEOUS) ×3 IMPLANT
DRAPE POUCH INSTRU U-SHP 10X18 (DRAPES) ×3 IMPLANT
DRAPE SURG 17X23 STRL (DRAPES) ×6 IMPLANT
DRILL NEURO 2X3.1 SOFT TOUCH (MISCELLANEOUS) ×3
ELECT REM PT RETURN 9FT ADLT (ELECTROSURGICAL) ×3
ELECTRODE REM PT RTRN 9FT ADLT (ELECTROSURGICAL) ×1 IMPLANT
GAUZE SPONGE 4X4 16PLY XRAY LF (GAUZE/BANDAGES/DRESSINGS) IMPLANT
GLOVE BIO SURGEON STRL SZ8 (GLOVE) ×3 IMPLANT
GLOVE BIO SURGEON STRL SZ8.5 (GLOVE) ×3 IMPLANT
GLOVE BIOGEL PI IND STRL 7.0 (GLOVE) ×1 IMPLANT
GLOVE BIOGEL PI IND STRL 7.5 (GLOVE) ×1 IMPLANT
GLOVE BIOGEL PI INDICATOR 7.0 (GLOVE) ×2
GLOVE BIOGEL PI INDICATOR 7.5 (GLOVE) ×2
GLOVE EXAM NITRILE LRG STRL (GLOVE) IMPLANT
GLOVE EXAM NITRILE MD LF STRL (GLOVE) IMPLANT
GLOVE EXAM NITRILE XL STR (GLOVE) IMPLANT
GLOVE EXAM NITRILE XS STR PU (GLOVE) IMPLANT
GLOVE INDICATOR 8.5 STRL (GLOVE) ×3 IMPLANT
GLOVE SS BIOGEL STRL SZ 8 (GLOVE) ×2 IMPLANT
GLOVE SUPERSENSE BIOGEL SZ 8 (GLOVE) ×4
GLOVE SURG SS PI 7.0 STRL IVOR (GLOVE) ×12 IMPLANT
GOWN STRL REUS W/ TWL LRG LVL3 (GOWN DISPOSABLE) IMPLANT
GOWN STRL REUS W/ TWL XL LVL3 (GOWN DISPOSABLE) ×4 IMPLANT
GOWN STRL REUS W/TWL LRG LVL3 (GOWN DISPOSABLE)
GOWN STRL REUS W/TWL XL LVL3 (GOWN DISPOSABLE) ×8
KIT BASIN OR (CUSTOM PROCEDURE TRAY) ×3 IMPLANT
KIT ROOM TURNOVER OR (KITS) ×3 IMPLANT
MARKER SKIN DUAL TIP RULER LAB (MISCELLANEOUS) ×3 IMPLANT
NEEDLE HYPO 22GX1.5 SAFETY (NEEDLE) ×3 IMPLANT
NEEDLE SPNL 18GX3.5 QUINCKE PK (NEEDLE) ×3 IMPLANT
NS IRRIG 1000ML POUR BTL (IV SOLUTION) ×3 IMPLANT
PACK LAMINECTOMY NEURO (CUSTOM PROCEDURE TRAY) ×3 IMPLANT
PIN DISTRACTION 14MM (PIN) ×6 IMPLANT
PLATE ANT CERV XTEND 2 LV 32 (Plate) ×3 IMPLANT
PUTTY 5ML ACTIFUSE ABX (Putty) ×3 IMPLANT
RUBBERBAND STERILE (MISCELLANEOUS) IMPLANT
SCREW XTD VAR 4.2 SELF TAP (Screw) ×18 IMPLANT
SPONGE GAUZE 4X4 12PLY (GAUZE/BANDAGES/DRESSINGS) ×3 IMPLANT
SPONGE INTESTINAL PEANUT (DISPOSABLE) ×6 IMPLANT
SPONGE SURGIFOAM ABS GEL SZ50 (HEMOSTASIS) ×3 IMPLANT
STRIP CLOSURE SKIN 1/2X4 (GAUZE/BANDAGES/DRESSINGS) ×2 IMPLANT
SUT VIC AB 0 CT1 27 (SUTURE) ×2
SUT VIC AB 0 CT1 27XBRD ANTBC (SUTURE) ×1 IMPLANT
SUT VIC AB 3-0 SH 8-18 (SUTURE) ×6 IMPLANT
SYR 20ML ECCENTRIC (SYRINGE) ×3 IMPLANT
TAPE CLOTH SURG 4X10 WHT LF (GAUZE/BANDAGES/DRESSINGS) ×3 IMPLANT
TOWEL OR 17X24 6PK STRL BLUE (TOWEL DISPOSABLE) ×3 IMPLANT
TOWEL OR 17X26 10 PK STRL BLUE (TOWEL DISPOSABLE) ×3 IMPLANT
VISTA S 14X14X7 (Spacer) ×6 IMPLANT
WATER STERILE IRR 1000ML POUR (IV SOLUTION) ×3 IMPLANT

## 2013-12-05 NOTE — Progress Notes (Signed)
Pt A&O x4; pt discharge education and instructions completed with pt and family at bedside. All voices understanding and denies any questions. Pt IV removed from pt; pt pre-medicated prior to discharge. Pt neck aspen brace remains on and aligned. Pt handed his prescription for valium and oxycodone; pt informed his DSS med submitted to his preferred pharmacy and to go pick it up. Pt transported off unit via wheelchair with belongings and family at side. Pt discharge home; pt's wife and family to transport pt to disposition. Arabella MerlesP. Amo Amarion Portell RN.

## 2013-12-05 NOTE — Op Note (Signed)
Brief history: The patient is a 38 year old white male who has complained of chronic neck and arm pain consistent with a cervical radiculopathy. He has failed medical management and was worked up with a cervical MRI. This demonstrated the patient had degenerative changes and spondylosis most prominent at C5-6 and C6-7. I discussed the various treatment options with the patient including surgery. He has weighed the risks, benefits, and alternatives surgery and decided proceed with a C5-6 and C6-7 anterior cervical discectomy, fusion, and plating.  Preoperative diagnosis: C5-6 and C6-7 disc degeneration, spondylosis, stenosis, cervical radiculopathy, cervicalgia  Postoperative diagnosis: The same  Procedure: C5-6 and C6-7 Anterior cervical discectomy/decompression; C5-C6 and C6-7 interbody arthrodesis with local morcellized autograft bone and Actifuse bone graft extender; insertion of interbody prosthesis at C5-6 and C6-7 (Zimmer peek interbody prosthesis); anterior cervical plating from C5-C7 with globus titanium plate  Surgeon: Dr. Delma OfficerJeff Mabile  Asst.: Dr. Jillyn HiddenGary cram  Anesthesia: Gen. endotracheal  Estimated blood loss: 100 cc  Drains: None  Complications: None  Description of procedure: The patient was brought to the operating room by the anesthesia team. General endotracheal anesthesia was induced. A roll was placed under the patient's shoulders to keep the neck in the neutral position. The patient's anterior cervical region was then prepared with Betadine scrub and Betadine solution. Sterile drapes were applied.  The area to be incised was then injected with Marcaine with epinephrine solution. I then used a scalpel to make a transverse incision in the patient's left anterior neck. I used the Metzenbaum scissors to divide the platysmal muscle and then to dissect medial to the sternocleidomastoid muscle, jugular vein, and carotid artery. I carefully dissected down towards the anterior cervical  spine identifying the esophagus and retracting it medially. Then using Kitner swabs to clear soft tissue from the anterior cervical spine. We then inserted a bent spinal needle into the upper exposed intervertebral disc space. We then obtained intraoperative radiographs confirm our location.  I then used electrocautery to detach the medial border of the longus colli muscle bilaterally from the C5-6 and C6-7 intervertebral disc spaces. I then inserted the Caspar self-retaining retractor underneath the longus colli muscle bilaterally to provide exposure.  We then incised the intervertebral disc at C6-7. We then performed a partial intervertebral discectomy with a pituitary forceps and the Karlin curettes. I then inserted distraction screws into the vertebral bodies at C6-7. We then distracted the interspace. We then used the high-speed drill to decorticate the vertebral endplates at C6-7, to drill away the remainder of the intervertebral disc, to drill away some posterior spondylosis, and to thin out the posterior longitudinal ligament. I then incised ligament with the arachnoid knife. We then removed the ligament with a Kerrison punches undercutting the vertebral endplates and decompressing the thecal sac. We then performed foraminotomies about the bilateral C7 nerve roots. This completed the decompression at this level.  We then repeated this procedure in an analogous fashion at C5-C6, decompressing the thecal sac and the bile C6 nerve roots.  We now turned our to attention to the interbody fusion. We used the trial spacers to determine the appropriate size for the interbody prosthesis. We then pre-filled prosthesis with a combination of local morcellized autograft bone that we obtained during decompression as well as Actifuse bone graft extender. We then inserted the prosthesis into the distracted interspace at C5-6 and C6-7. We then removed the distraction screws. There was a good snug fit of the  prosthesis in the interspace.   Having completed  the fusion we now turned attention to the anterior spinal instrumentation. We used the high-speed drill to drill away some anterior spondylosis at the disc spaces so that the plate lay down flat. We selected the appropriate length titanium anterior cervical plate. We laid it along the anterior aspect of the vertebral bodies from C5-C7. We then drilled 14 mm holes at C5, C6 and C7. We then secured the plate to the vertebral bodies by placing two 14 mm self-tapping screws at C5, C6 and C7. We then obtained intraoperative radiograph. The demonstrating good position of the instrumentation. We therefore secured the screws the plate the locking each cam. This completed the instrumentation.  We then obtained hemostasis using bipolar electrocautery. We irrigated the wound out with bacitracin solution. We then removed the retractor. We inspected the esophagus for any damage. There was none apparent. We then reapproximated patient's platysmal muscle with interrupted 3-0 Vicryl suture. We then reapproximated the subcutaneous tissue with interrupted 3-0 Vicryl suture. The skin was reapproximated with Steri-Strips and benzoin. The wound was then covered with bacitracin ointment. A sterile dressing was applied. The drapes were removed. Patient was subsequently extubated by the anesthesia team and transported to the post anesthesia care unit in stable condition. All sponge instrument and needle counts were reportedly correct at the end of this case.

## 2013-12-05 NOTE — Transfer of Care (Signed)
Immediate Anesthesia Transfer of Care Note  Patient: Chad MediciRobert M Lowe  Procedure(s) Performed: Procedure(s): ANTERIOR CERVICAL DECOMPRESSION/DISCECTOMY FUSION INTERBODY PROSTHESIS,PLATING CERVICAL FIVE-SIX,CERVICAL SIX-SEVEN   (N/A)  Patient Location: PACU  Anesthesia Type:General  Level of Consciousness: awake, alert , oriented and patient cooperative  Airway & Oxygen Therapy: Patient Spontanous Breathing  Post-op Assessment: Report given to PACU RN, Post -op Vital signs reviewed and stable and Patient moving all extremities X 4  Post vital signs: Reviewed and stable  Complications: No apparent anesthesia complications

## 2013-12-05 NOTE — Progress Notes (Signed)
Subjective:  The patient is alert and pleasant. He is in no apparent distress. He looks well.  Objective: Vital signs in last 24 hours: Temp:  [98.2 F (36.8 C)-98.4 F (36.9 C)] 98.4 F (36.9 C) (06/15 1259) Pulse Rate:  [64-67] 67 (06/15 1259) Resp:  [17-20] 17 (06/15 1259) BP: (95-115)/(64-81) 95/81 mmHg (06/15 1259) SpO2:  [97 %-100 %] 97 % (06/15 1259) Weight:  [83.462 kg (184 lb)] 83.462 kg (184 lb) (06/15 0840)  Intake/Output from previous day:   Intake/Output this shift: Total I/O In: 1000 [I.V.:1000] Out: 200 [Blood:200]  Physical exam the patient is alert and oriented. He is moving all 4 extremities well. His dressing is clean and dry. There is no evidence of hematoma or shift.  Lab Results: No results found for this basename: WBC, HGB, HCT, PLT,  in the last 72 hours BMET No results found for this basename: NA, K, CL, CO2, GLUCOSE, BUN, CREATININE, CALCIUM,  in the last 72 hours  Studies/Results: No results found.  Assessment/Plan: The patient is doing well.  LOS: 0 days     Jemison,Khyle Goodell D 12/05/2013, 1:10 PM

## 2013-12-05 NOTE — Anesthesia Preprocedure Evaluation (Signed)
Anesthesia Evaluation  Patient identified by MRN, date of birth, ID band Patient awake    Reviewed: Allergy & Precautions, H&P , NPO status , Patient's Chart, lab work & pertinent test results  Airway Mallampati: II  Neck ROM: full    Dental   Pulmonary Current Smoker,          Cardiovascular negative cardio ROS      Neuro/Psych  Headaches,    GI/Hepatic   Endo/Other    Renal/GU      Musculoskeletal  (+) Arthritis -,   Abdominal   Peds  Hematology   Anesthesia Other Findings   Reproductive/Obstetrics                           Anesthesia Physical Anesthesia Plan  ASA: II  Anesthesia Plan: General   Post-op Pain Management:    Induction: Intravenous  Airway Management Planned: Oral ETT  Additional Equipment:   Intra-op Plan:   Post-operative Plan: Extubation in OR  Informed Consent: I have reviewed the patients History and Physical, chart, labs and discussed the procedure including the risks, benefits and alternatives for the proposed anesthesia with the patient or authorized representative who has indicated his/her understanding and acceptance.     Plan Discussed with: CRNA, Anesthesiologist and Surgeon  Anesthesia Plan Comments:         Anesthesia Quick Evaluation

## 2013-12-05 NOTE — Discharge Summary (Signed)
Physician Discharge Summary  Patient ID: Chad MediciRobert M Dull MRN: [id number] DOB/AGE: October 05, 1975 38 y.o.  Admit date: 12/05/2013 Discharge date: 12/05/2013  Admission Diagnoses: C5-C6 and C6-7 disc degeneration, spondylosis, stenosis, cervical radiculopathy, cervicalgia  Discharge Diagnoses: The same Active Problems:   Cervical spondylosis with radiculopathy   Discharged Condition: good  Hospital Course: I performed a C5-C6 and C6-7 anterior cervical discectomy, fusion, and plating on the patient on 12/05/2013.   The patient's postoperative course was unremarkable. On the afternoon of the surgery, the patient requested discharge to home. The patient, and his wife, were given oral and written discharge instructions. All their questions were answered.  Consults: None Significant Diagnostic Studies: None Treatments: C5-6 and C6-7 anterior cervical discectomy, fusion, and plating. Discharge Exam: Blood pressure 115/67, pulse 58, temperature 98.5 F (36.9 C), temperature source Oral, resp. rate 15, height 6\' 2"  (1.88 m), weight 83.462 kg (184 lb), SpO2 96.00%. The patient is alert and oriented. His strength is grossly normal in all 4 extremities. His dressing is clean and dry. There is no evidence of hematoma or shift.  Disposition: Home  Discharge Instructions   Call MD for:  difficulty breathing, headache or visual disturbances    Complete by:  As directed      Call MD for:  extreme fatigue    Complete by:  As directed      Call MD for:  hives    Complete by:  As directed      Call MD for:  persistant dizziness or light-headedness    Complete by:  As directed      Call MD for:  persistant nausea and vomiting    Complete by:  As directed      Call MD for:  redness, tenderness, or signs of infection (pain, swelling, redness, odor or green/yellow discharge around incision site)    Complete by:  As directed      Call MD for:  severe uncontrolled pain    Complete by:  As directed       Call MD for:  temperature >100.4    Complete by:  As directed      Diet - low sodium heart healthy    Complete by:  As directed      Discharge instructions    Complete by:  As directed   Call [phone] for a followup appointment. Take a stool softener while you are using pain medications.     Driving Restrictions    Complete by:  As directed   Do not drive for 2 weeks.     Increase activity slowly    Complete by:  As directed      Lifting restrictions    Complete by:  As directed   Do not lift more than 5 pounds. No excessive bending or twisting.     May shower / Bathe    Complete by:  As directed   He may shower after the pain she is removed 3 days after surgery. Leave the incision alone.     Remove dressing in 48 hours    Complete by:  As directed   Your stitches are under the scan and will dissolve by themselves. The Steri-Strips will fall off after you take a few showers. Do not rub back or pick at the wound, Leave the wound alone.            Medication List         alprazolam 2 MG tablet  Commonly known as:  XANAX  Take 2 mg by mouth at bedtime as needed for sleep.     diazepam 5 MG tablet  Commonly known as:  VALIUM  Take 1 tablet (5 mg total) by mouth every 6 (six) hours as needed for muscle spasms.     docusate sodium 100 MG capsule  Commonly known as:  COLACE  Take 100 mg by mouth daily as needed for mild constipation.     DSS 100 MG Caps  Take 100 mg by mouth 2 (two) times daily.     oxyCODONE 15 MG immediate release tablet  Commonly known as:  ROXICODONE  Take 1 tablet (15 mg total) by mouth every 4 (four) hours as needed for severe pain.         SignedCristi Loron: Hemme,Karin Griffith D 12/05/2013, 4:57 PM

## 2013-12-05 NOTE — OR Nursing (Signed)
Patient has earrings in both ears that patient states cannot be removed.Asked patient if we could cut the earrings and explained that due to the site of the surgery being on the neck and the use of electrical cartery during surgery there was a danger of fire.Patient states he understands the danger and would prefer that we do not cut the earrings due to the expense of the earrings. Once patient was asleep the earrings were covered with transpore tape. DDay RN

## 2013-12-05 NOTE — Progress Notes (Signed)
Ambulated around the whole unit with pt with no difficulty noticed. Arabella MerlesP. Amo Halima Fogal RN.

## 2013-12-05 NOTE — H&P (Signed)
Subjective: The patient is a 38 year old white male who has previously suffered an upper cervical fracture . He has had chronic neck and arm pain. This has progressively worsened. He has failed medical management. He was worked up with a cervical MRI which demonstrated cervical spondylosis and stenosis most prominent at C5-6 and C6-7. I discussed the various treatment option with the patient including surgery. He has weighed the risks, benefits, and alternatives surgery and decided proceed with a C5-C6 and C6-7 anterior cervical discectomy, fusion, and plating.  Past Medical History  Diagnosis Date  . Post traumatic stress disorder   . Head injury, acute, without loss of consciousness   . Headache(784.0)   . Kidney stone     4mm right kidney- hasnt moved in years  . Arthritis     neck, knee    Past Surgical History  Procedure Laterality Date  . Thumb surgery Right   . Knee surgery Right     part of patella removed  . Posterior cervical laminectomy      with bone graft, right and lefthip, back  (3 surgies)  . Cervical fusion      1-3    No Known Allergies  History  Substance Use Topics  . Smoking status: Current Every Day Smoker -- 0.50 packs/day for 18 years  . Smokeless tobacco: Not on file  . Alcohol Use: No     Comment: rarley    History reviewed. No pertinent family history. Prior to Admission medications   Medication Sig Start Date End Date Taking? Authorizing Provider  alprazolam Prudy Feeler(XANAX) 2 MG tablet Take 2 mg by mouth at bedtime as needed for sleep.   Yes Historical Provider, MD  docusate sodium (COLACE) 100 MG capsule Take 100 mg by mouth daily as needed for mild constipation.   Yes Historical Provider, MD  Oxycodone HCl 10 MG TABS Take 10 mg by mouth 5 (five) times daily.   Yes Historical Provider, MD     Review of Systems  Positive ROS: As above  All other systems have been reviewed and were otherwise negative with the exception of those mentioned in the HPI and  as above.  Objective: Vital signs in last 24 hours: Temp:  [98.2 F (36.8 C)] 98.2 F (36.8 C) (06/15 0840) Pulse Rate:  [64] 64 (06/15 0840) Resp:  [20] 20 (06/15 0840) BP: (115)/(64) 115/64 mmHg (06/15 0840) SpO2:  [100 %] 100 % (06/15 0840) Weight:  [83.462 kg (184 lb)] 83.462 kg (184 lb) (06/15 0840)  General Appearance: Alert, cooperative, no distress, Head: Normocephalic, without obvious abnormality, atraumatic Eyes: PERRL, conjunctiva/corneas clear, EOM's intact,    Ears: Normal  Throat: Normal  Neck: Supple, symmetrical, trachea midline, no adenopathy; thyroid: No enlargement/tenderness/nodules; no carotid bruit or JVD. The patient's cervical incisions are well-healed. He has limited cervical range of motion. Back: Symmetric, no curvature, ROM normal, no CVA tenderness Lungs: Clear to auscultation bilaterally, respirations unlabored Heart: Regular rate and rhythm, no murmur, rub or gallop Abdomen: Soft, non-tender,, no masses, no organomegaly Extremities: Extremities normal, atraumatic, no cyanosis or edema Pulses: 2+ and symmetric all extremities Skin: Skin color, texture, turgor normal, no rashes or lesions  NEUROLOGIC:   Mental status: alert and oriented, no aphasia, good attention span, Fund of knowledge/ memory ok Motor Exam - grossly normal Sensory Exam - grossly normal Reflexes:  Coordination - grossly normal Gait - grossly normal Balance - grossly normal Cranial Nerves: I: smell Not tested  II: visual acuity  OS: Normal  OD: Normal   II: visual fields Full to confrontation  II: pupils Equal, round, reactive to light  III,VII: ptosis None  III,IV,VI: extraocular muscles  Full ROM  V: mastication Normal  V: facial light touch sensation  Normal  V,VII: corneal reflex  Present  VII: facial muscle function - upper  Normal  VII: facial muscle function - lower Normal  VIII: hearing Not tested  IX: soft palate elevation  Normal  IX,X: gag reflex Present  XI:  trapezius strength  5/5  XI: sternocleidomastoid strength 5/5  XI: neck flexion strength  5/5  XII: tongue strength  Normal    Data Review Lab Results  Component Value Date   WBC 8.1 11/28/2013   HGB 12.9* 11/28/2013   HCT 35.5* 11/28/2013   MCV 86.6 11/28/2013   PLT 203 11/28/2013   Lab Results  Component Value Date   NA 142 11/28/2013   K 3.8 11/28/2013   CL 103 11/28/2013   CO2 28 11/28/2013   BUN 9 11/28/2013   CREATININE 0.99 11/28/2013   GLUCOSE 87 11/28/2013   No results found for this basename: INR, PROTIME    Assessment/Plan: C5-C6 and C6-7 disc degeneration, spondylosis, stenosis, cervical radiculopathy, cervicalgia: I have discussed situation with the patient. We have discussed the various treatment options including surgery. I have described the surgical treatment option of a C5-C6 and C6-7 anterior cervical discectomy, fusion, and plating. I've shown him surgical models. We have discussed the risks, benefits, alternatives, and likelihood of achieving our goals with surgery. I've answered all the patient's questions. He has decided to proceed with surgery.   Yeoman,Alixandria Friedt D 12/05/2013 9:31 AM

## 2013-12-05 NOTE — Anesthesia Postprocedure Evaluation (Signed)
Anesthesia Post Note  Patient: Arna MediciRobert M Radich  Procedure(s) Performed: Procedure(s) (LRB): ANTERIOR CERVICAL DECOMPRESSION/DISCECTOMY FUSION INTERBODY PROSTHESIS,PLATING CERVICAL FIVE-SIX,CERVICAL SIX-SEVEN   (N/A)  Anesthesia type: General  Patient location: PACU  Post pain: Pain level controlled and Adequate analgesia  Post assessment: Post-op Vital signs reviewed, Patient's Cardiovascular Status Stable, Respiratory Function Stable, Patent Airway and Pain level controlled  Last Vitals:  Filed Vitals:   12/05/13 1344  BP:   Pulse: 52  Temp: 36 C  Resp: 10    Post vital signs: Reviewed and stable  Level of consciousness: awake, alert  and oriented  Complications: No apparent anesthesia complications

## 2013-12-05 NOTE — Progress Notes (Signed)
1400 Pt arrived on the unit from pacu via stretcher. Pt A&O x4; dsg to neck clean, dry and intact, IV remains intact and infusing, MAE x4. Pt oriented to the unit and room, call light within reach. Pt pain assessed; earrings remains intact in pt ears. Pt call light within reach and spouse at bedside. Will continue to monitor quietly. Chad MerlesP. Amo Adrielle Polakowski RN.

## 2013-12-11 ENCOUNTER — Encounter (HOSPITAL_COMMUNITY): Payer: Self-pay | Admitting: Neurosurgery

## 2015-02-01 IMAGING — CR DG CERVICAL SPINE 2 OR 3 VIEWS
1 series · 1 of 1 positions shown · non-contrast
Comparison: MRI from 05/06/2013.  CT scan from 02/03/2013.

CLINICAL DATA: Cervical fusion.

EXAM:
CERVICAL SPINE - 2-3 VIEW

[lat]
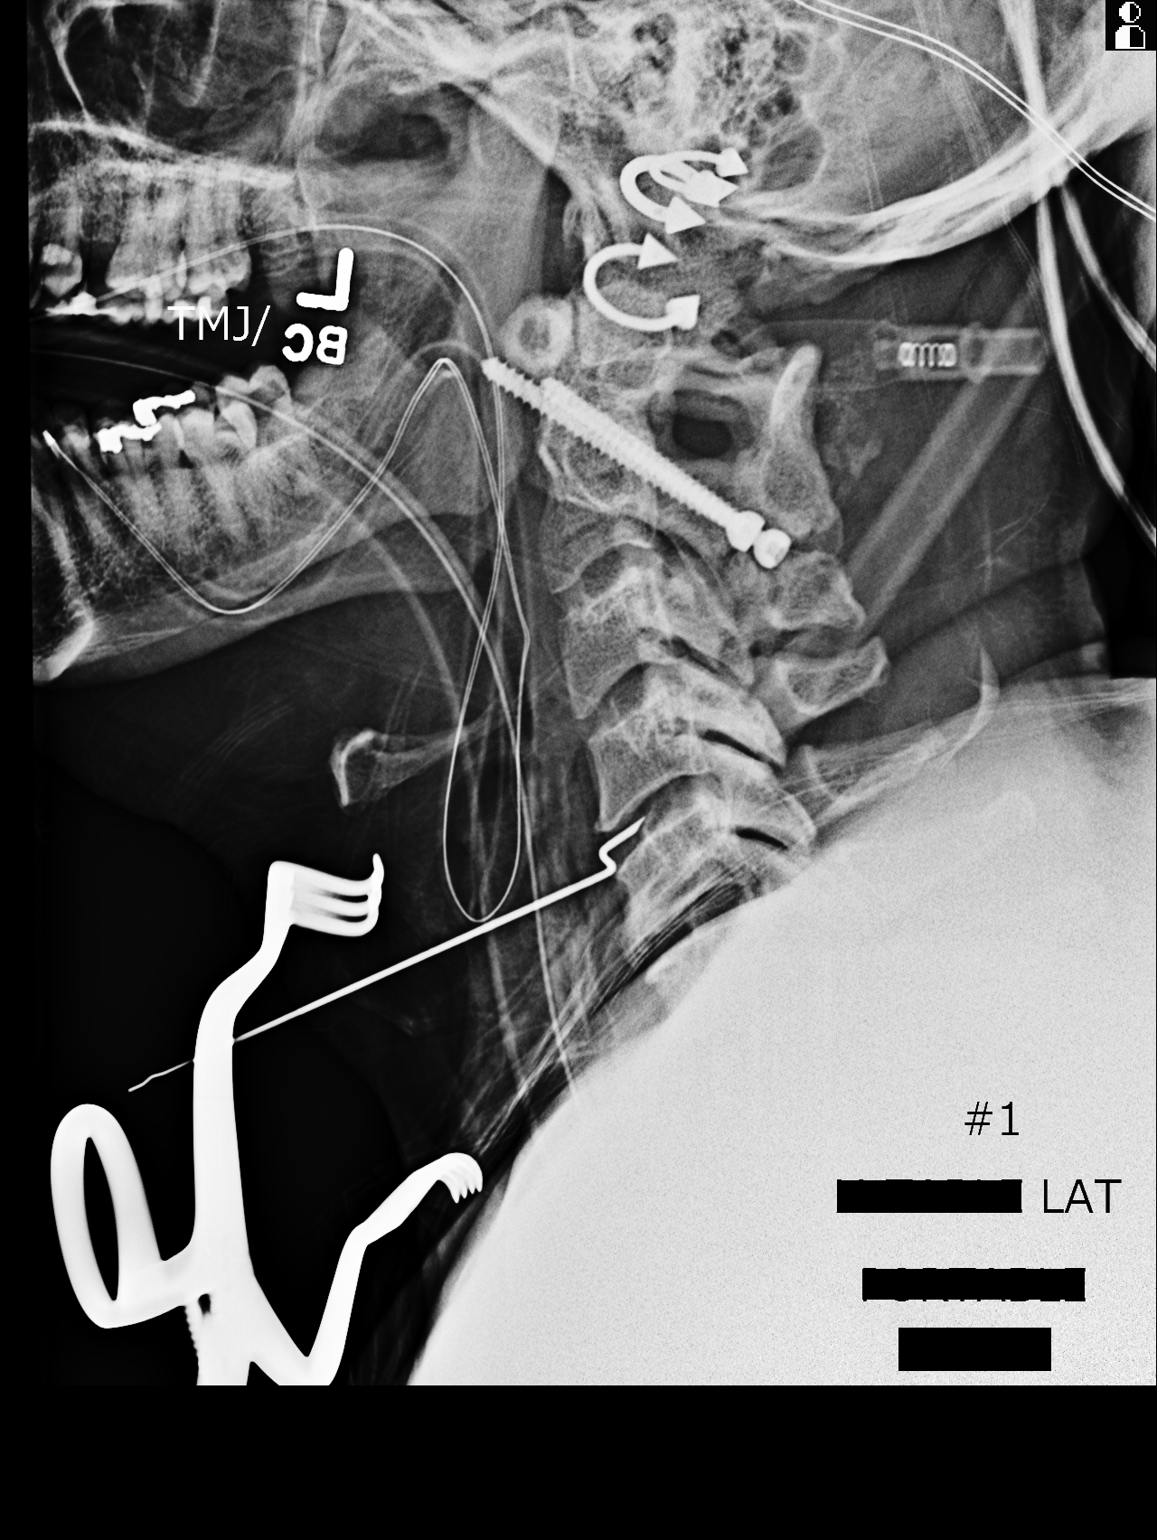

[1 of 1 positions shown; findings below may reference images not displayed]

FINDINGS: Cross-table lateral view of the cervical spine obtained at 7808 hrs
is labeled #1. Transpedicular fusion screws are seen at C2, as
before. Soft tissue spreaders are noted in the anterior neck. There
is a radiopaque localization device anteriorly with the tip
projecting over the C4-5 anterior interspace. Endotracheal and of OG
tubes overlie the anterior soft tissues. Looped catheter over the
anterior soft tissues may be external to the patient or represent 2
being coiled in the hypopharynx.

Second film labeled labeled #2 shows interbody graft material at
C5-6 with anterior cervical plate seen extending from the C5 level
down to C7. The C6-7 interspace is obscured by the overlying soft
tissue and the caudal extent of the plate is not well visualized.
Surgical sponges are noted in the operative bed.
IMPRESSION: Status post anterior cervical discectomy at C5-6 C6-7. Anterior
plate appears well approximated at C5-6. The fusion with at C6-7 is
not well seen secondary obscuration of the overlying shoulders.

## 2018-06-06 DIAGNOSIS — T50901A Poisoning by unspecified drugs, medicaments and biological substances, accidental (unintentional), initial encounter: Secondary | ICD-10-CM

## 2018-06-06 DIAGNOSIS — D72829 Elevated white blood cell count, unspecified: Secondary | ICD-10-CM

## 2018-06-06 DIAGNOSIS — N179 Acute kidney failure, unspecified: Secondary | ICD-10-CM

## 2018-06-06 DIAGNOSIS — J690 Pneumonitis due to inhalation of food and vomit: Secondary | ICD-10-CM | POA: Diagnosis not present

## 2018-06-06 DIAGNOSIS — J9600 Acute respiratory failure, unspecified whether with hypoxia or hypercapnia: Secondary | ICD-10-CM | POA: Diagnosis not present

## 2018-06-06 DIAGNOSIS — E872 Acidosis: Secondary | ICD-10-CM

## 2018-06-06 DIAGNOSIS — I959 Hypotension, unspecified: Secondary | ICD-10-CM

## 2018-06-07 DIAGNOSIS — J9600 Acute respiratory failure, unspecified whether with hypoxia or hypercapnia: Secondary | ICD-10-CM | POA: Diagnosis not present

## 2018-06-07 DIAGNOSIS — E872 Acidosis: Secondary | ICD-10-CM | POA: Diagnosis not present

## 2018-06-07 DIAGNOSIS — N179 Acute kidney failure, unspecified: Secondary | ICD-10-CM | POA: Diagnosis not present

## 2018-06-07 DIAGNOSIS — J690 Pneumonitis due to inhalation of food and vomit: Secondary | ICD-10-CM | POA: Diagnosis not present

## 2018-06-08 DIAGNOSIS — N179 Acute kidney failure, unspecified: Secondary | ICD-10-CM | POA: Diagnosis not present

## 2018-06-08 DIAGNOSIS — E872 Acidosis: Secondary | ICD-10-CM | POA: Diagnosis not present

## 2018-06-08 DIAGNOSIS — J9600 Acute respiratory failure, unspecified whether with hypoxia or hypercapnia: Secondary | ICD-10-CM | POA: Diagnosis not present

## 2018-06-08 DIAGNOSIS — J690 Pneumonitis due to inhalation of food and vomit: Secondary | ICD-10-CM | POA: Diagnosis not present

## 2018-06-09 DIAGNOSIS — J690 Pneumonitis due to inhalation of food and vomit: Secondary | ICD-10-CM | POA: Diagnosis not present

## 2018-06-09 DIAGNOSIS — E872 Acidosis: Secondary | ICD-10-CM | POA: Diagnosis not present

## 2018-06-09 DIAGNOSIS — J9600 Acute respiratory failure, unspecified whether with hypoxia or hypercapnia: Secondary | ICD-10-CM | POA: Diagnosis not present

## 2018-06-09 DIAGNOSIS — N179 Acute kidney failure, unspecified: Secondary | ICD-10-CM | POA: Diagnosis not present

## 2018-06-10 DIAGNOSIS — J690 Pneumonitis due to inhalation of food and vomit: Secondary | ICD-10-CM | POA: Diagnosis not present

## 2018-06-10 DIAGNOSIS — N179 Acute kidney failure, unspecified: Secondary | ICD-10-CM | POA: Diagnosis not present

## 2018-06-10 DIAGNOSIS — E872 Acidosis: Secondary | ICD-10-CM | POA: Diagnosis not present

## 2018-06-10 DIAGNOSIS — J9600 Acute respiratory failure, unspecified whether with hypoxia or hypercapnia: Secondary | ICD-10-CM | POA: Diagnosis not present

## 2023-10-22 DEATH — deceased
